# Patient Record
Sex: Male | Born: 1970 | Race: White | Hispanic: No | Smoking: Current every day smoker
Health system: Southern US, Community
[De-identification: ages and names within clinical notes are randomized; demographics above are authoritative.]

## PROBLEM LIST (undated history)

## (undated) DIAGNOSIS — I10 Essential (primary) hypertension: Secondary | ICD-10-CM

## (undated) DIAGNOSIS — E785 Hyperlipidemia, unspecified: Secondary | ICD-10-CM

## (undated) DIAGNOSIS — E669 Obesity, unspecified: Secondary | ICD-10-CM

## (undated) DIAGNOSIS — M109 Gout, unspecified: Secondary | ICD-10-CM

## (undated) DIAGNOSIS — Z720 Tobacco use: Secondary | ICD-10-CM

## (undated) HISTORY — DX: Gout, unspecified: M10.9

## (undated) HISTORY — PX: NO PAST SURGERIES: SHX2092

## (undated) HISTORY — DX: Tobacco use: Z72.0

## (undated) HISTORY — DX: Essential (primary) hypertension: I10

## (undated) HISTORY — DX: Hyperlipidemia, unspecified: E78.5

## (undated) HISTORY — DX: Obesity, unspecified: E66.9

---

## 2016-08-03 ENCOUNTER — Emergency Department (INDEPENDENT_AMBULATORY_CARE_PROVIDER_SITE_OTHER): Payer: 59

## 2016-08-03 ENCOUNTER — Emergency Department
Admission: EM | Admit: 2016-08-03 | Discharge: 2016-08-03 | Disposition: A | Discharge status: Home / Self Care | Payer: 59 | Source: Home / Self Care | Attending: Family Medicine | Admitting: Family Medicine

## 2016-08-03 ENCOUNTER — Encounter: Payer: Self-pay | Admitting: Emergency Medicine

## 2016-08-03 DIAGNOSIS — M25462 Effusion, left knee: Secondary | ICD-10-CM | POA: Diagnosis not present

## 2016-08-03 DIAGNOSIS — S8992XA Unspecified injury of left lower leg, initial encounter: Secondary | ICD-10-CM

## 2016-08-03 DIAGNOSIS — M25562 Pain in left knee: Secondary | ICD-10-CM

## 2016-08-03 MED ORDER — OXYCODONE-ACETAMINOPHEN 5-325 MG PO TABS: 1.0000 | ORAL_TABLET | Freq: Four times a day (QID) | ORAL | 0 refills | Status: DC | PRN

## 2016-08-03 NOTE — Discharge Instructions (Signed)
Percocet (oxycodone-acetaminophen) is a narcotic pain medication, do not combine these medications with others containing tylenol. While taking, do not drink alcohol, drive, or perform any other activities that requires focus while taking these medications.  ° ° ° °

## 2016-08-03 NOTE — ED Notes (Signed)
Ice pack applied, refused PRN for pain

## 2016-08-03 NOTE — ED Triage Notes (Signed)
[facility] with C/O severe pain in the left knee. Stepped off a step stool yesterday and felt a pop and had immediate pain. Rates pain 10/10. Edema noted patient refused something for pain.

## 2016-08-03 NOTE — ED Provider Notes (Signed)
CSN: [id number]     Arrival date & time 08/03/16  1208 History   First MD Initiated Contact with Patient 08/03/16 1321     Chief Complaint  Patient presents with  . Knee Pain   (Consider location/radiation/quality/duration/timing/severity/associated sxs/prior Treatment) HPI Rene Sizelove is a 46 y.o. male presenting to UC with c/o sudden onset Left knee pain that started after he stepped off a step stool and felt a pop.  Pain is 10/10 with weightbearing and movement.  He also noted swelling of his knee. Pain is aching and sore. He took Aleve PTA w/o much relief.  No prior hx of knee injury or surgery.    History reviewed. No pertinent past medical history. History reviewed. No pertinent surgical history. History reviewed. No pertinent family history. Social History  Substance Use Topics  . Smoking status: Current Every Day Smoker  . Smokeless tobacco: Current User  . Alcohol use No    Review of Systems  Musculoskeletal: Positive for arthralgias, gait problem, joint swelling and myalgias.  Skin: Negative for color change and wound.  Neurological: Negative for weakness and numbness.    Allergies  Eggs or egg-derived products  Home Medications   Prior to Admission medications   Medication Sig Start Date End Date Taking? Authorizing Provider  naproxen sodium (ANAPROX) 220 MG tablet Take 220 mg by mouth 2 (two) times daily with a meal.   Yes [provider]  oxyCODONE-acetaminophen (PERCOCET/ROXICET) 5-325 MG tablet Take 1-2 tablets by mouth every 6 (six) hours as needed for severe pain. 08/03/16   Junius Finner, PA-C   Meds Ordered and Administered this Visit  Medications - No data to display  BP (!) 132/91 (BP Location: Left Arm)   Pulse 92   Temp 97.7 F (36.5 C) (Oral)   Resp 18   Ht 6' (1.829 m)   Wt 225 lb (102.1 kg)   SpO2 97%   BMI 30.52 kg/m  No data found.   Physical Exam  Constitutional: He is oriented to person, place, and time. He appears  well-developed and well-nourished. No distress.  HENT:  Head: Normocephalic and atraumatic.  Eyes: EOM are normal.  Neck: Normal range of motion.  Cardiovascular: Normal rate.   Pulmonary/Chest: Effort normal.  Musculoskeletal: He exhibits edema and tenderness.  Left knee: moderate edema. Tenderness to superior aspect and joint space. Slight decrease extension. Limited flexion due to pain. Calf is soft, non-tender.   Neurological: He is alert and oriented to person, place, and time.  Skin: Skin is warm and dry. He is not diaphoretic.  Left knee: skin in tact. No ecchymosis or erythema.   Psychiatric: He has a normal mood and affect. His behavior is normal.  Nursing note and vitals reviewed.   Urgent Care Course     Procedures (including critical care time)  Labs Review Labs Reviewed - No data to display  Imaging Review Dg Knee Complete 4 Views Left  Result Date: 08/03/2016 CLINICAL DATA:  Pain, swelling of left knee.  Felt a pop yesterday. EXAM: LEFT KNEE - COMPLETE 4+ VIEW COMPARISON:  None. FINDINGS: Moderate joint effusion. No acute bony abnormality. Specifically, no fracture, subluxation, or dislocation. Soft tissues are intact. IMPRESSION: Moderate joint effusion.  No acute bony abnormality. Electronically Signed   By: Charlett Nose M.D.   On: 08/03/2016 13:41    MDM   1. Left knee injury, initial encounter   2. Pain and swelling of left knee   3. Effusion of left knee joint  Left knee: moderate edema with pain after feeling a Pop yesterday coming off a step stool  Plain films: moderate joint effusion Knee brace applied, crutches provided for comfort.  Pt states Vicodin does not help with pain. Checked [place] Controlled Substance Reporting System. No prescriptions listed for the last 3 years.  Rx: Percocet  Encouraged to call Dr. Benjamin Stainhekkekandam, Sports Medicine for f/u tomorrow.     Junius FinnerO'Malley, Millard Bautch, PA-C 08/03/16 [phone]

## 2016-08-03 NOTE — ED Notes (Signed)
Applied a knee brace and fitted for crutches.

## 2016-08-04 ENCOUNTER — Encounter: Payer: Self-pay | Admitting: Sports Medicine

## 2016-08-04 ENCOUNTER — Ambulatory Visit (INDEPENDENT_AMBULATORY_CARE_PROVIDER_SITE_OTHER): Payer: 59 | Admitting: Sports Medicine

## 2016-08-04 DIAGNOSIS — M119 Crystal arthropathy, unspecified: Secondary | ICD-10-CM | POA: Diagnosis not present

## 2016-08-04 DIAGNOSIS — M6580 Other synovitis and tenosynovitis, unspecified site: Secondary | ICD-10-CM | POA: Diagnosis not present

## 2016-08-04 DIAGNOSIS — M109 Gout, unspecified: Secondary | ICD-10-CM | POA: Insufficient documentation

## 2016-08-04 NOTE — Assessment & Plan Note (Signed)
Suspect gout. Crystal analysis. In a week we are going to check his uric acid levels, he tells me they've been as high as 9 before. I do suspect we will need to add allopurinol. He's also going to establish with my partners for primary care. I don't think he needs an MRI.

## 2016-08-04 NOTE — Patient Instructions (Signed)
Low-Purine Diet Purines are compounds that affect the level of uric acid in your body. A low-purine diet is a diet that is low in purines. Eating a low-purine diet can prevent the level of uric acid in your body from getting too high and causing gout or kidney stones or both. What do I need to know about this diet?  Choose low-purine foods. Examples of low-purine foods are listed in the next section.  Drink plenty of fluids, especially water. Fluids can help remove uric acid from your body. Try to drink 8-16 cups (1.9-3.8 L) a day.  Limit foods high in fat, especially saturated fat, as fat makes it harder for the body to get rid of uric acid. Foods high in saturated fat include pizza, cheese, ice cream, whole milk, fried foods, and gravies. Choose foods that are lower in fat and lean sources of protein. Use olive oil when cooking as it contains healthy fats that are not high in saturated fat.  Limit alcohol. Alcohol interferes with the elimination of uric acid from your body. If you are having a gout attack, avoid all alcohol.  Keep in mind that different people's bodies react differently to different foods. You will probably learn over time which foods do or do not affect you. If you discover that a food tends to cause your gout to flare up, avoid eating that food. You can more freely enjoy foods that do not cause problems. If you have any questions about a food item, talk to your dietitian or health care provider. Which foods are low, moderate, and high in purines? The following is a list of foods that are low, moderate, and high in purines. You can eat any amount of the foods that are low in purines. You may be able to have small amounts of foods that are moderate in purines. Ask your health care provider how much of a food moderate in purines you can have. Avoid foods high in purines. Grains  Foods low in purines: Enriched white bread, pasta, rice, cake, cornbread, popcorn.  Foods moderate in  purines: Whole-grain breads and cereals, wheat germ, bran, oatmeal. Uncooked oatmeal. Dry wheat bran or wheat germ.  Foods high in purines: Pancakes, French toast, biscuits, muffins. Vegetables  Foods low in purines: All vegetables, except those that are moderate in purines.  Foods moderate in purines: Asparagus, cauliflower, spinach, mushrooms, green peas. Fruits  All fruits are low in purines. Meats and other Protein Foods  Foods low in purines: Eggs, nuts, peanut butter.  Foods moderate in purines: 80-90% lean beef, lamb, veal, pork, poultry, fish, eggs, peanut butter, nuts. Crab, lobster, oysters, and shrimp. Cooked dried beans, peas, and lentils.  Foods high in purines: Anchovies, sardines, herring, mussels, tuna, codfish, scallops, trout, and haddock. Bacon. Organ meats (such as liver or kidney). Tripe. Game meat. Goose. Sweetbreads. Dairy  All dairy foods are low in purines. Low-fat and fat-free dairy products are best because they are low in saturated fat. Beverages  Drinks low in purines: Water, carbonated beverages, tea, coffee, cocoa.  Drinks moderate in purines: Soft drinks and other drinks sweetened with high-fructose corn syrup. Juices. To find whether a food or drink is sweetened with high-fructose corn syrup, look at the ingredients list.  Drinks high in purines: Alcoholic beverages (such as beer). Condiments  Foods low in purines: Salt, herbs, olives, pickles, relishes, vinegar.  Foods moderate in purines: Butter, margarine, oils, mayonnaise. Fats and Oils  Foods low in purines: All types, except gravies   and sauces made with meat.  Foods high in purines: Gravies and sauces made with meat. Other Foods  Foods low in purines: Sugars, sweets, gelatin. Cake. Soups made without meat.  Foods moderate in purines: Meat-based or fish-based soups, broths, or bouillons. Foods and drinks sweetened with high-fructose corn syrup.  Foods high in purines: High-fat desserts  (such as ice cream, cookies, cakes, pies, doughnuts, and chocolate). Contact your dietitian for more information on foods that are not listed here. This information is not intended to replace advice given to you by your health care provider. Make sure you discuss any questions you have with your health care provider. Document Released: 06/14/2010 Document Revised: 07/26/2015 Document Reviewed: 01/24/2013 Elsevier Interactive Patient Education  2017 Else[employer] Inc[employer]  Gout Gout is painful swelling that can occur in some of your joints. Gout is a type of arthritis. This condition is caused by having too much uric acid in your body. Uric acid is a chemical that forms when your body breaks down substances called purines. Purines are important for building body proteins. When your body has too much uric acid, sharp crystals can form and build up inside your joints. This causes pain and swelling. Gout attacks can happen quickly and be very painful (acute gout). Over time, the attacks can affect more joints and become more frequent (chronic gout). Gout can also cause uric acid to build up under your skin and inside your kidneys. What are the causes? This condition is caused by too much uric acid in your blood. This can occur because:  Your kidneys do not remove enough uric acid from your blood. This is the most common cause.  Your body makes too much uric acid. This can occur with some cancers and cancer treatments. It can also occur if your body is breaking down too many red blood cells (hemolytic anemia).  You eat too many foods that are high in purines. These foods include organ meats and some seafood. Alcohol, especially beer, is also high in purines.  A gout attack may be triggered by trauma or stress. What increases the risk? This condition is more likely to develop in people who:  Have a family history of gout.  Are male and middle-aged.  Are male and have gone through menopause.  Are  obese.  Frequently drink alcohol, especially beer.  Are dehydrated.  Lose weight too quickly.  Have an organ transplant.  Have lead poisoning.  Take certain medicines, including aspirin, cyclosporine, diuretics, levodopa, and niacin.  Have kidney disease or psoriasis.  What are the signs or symptoms? An attack of acute gout happens quickly. It usually occurs in just one joint. The most common place is the big toe. Attacks often start at night. Other joints that may be affected include joints of the feet, ankle, knee, fingers, wrist, or elbow. Symptoms may include:  Severe pain.  Warmth.  Swelling.  Stiffness.  Tenderness. The affected joint may be very painful to touch.  Shiny, red, or purple skin.  Chills and fever.  Chronic gout may cause symptoms more frequently. More joints may be involved. You may also have white or yellow lumps (tophi) on your hands or feet or in other areas near your joints. How is this diagnosed? This condition is diagnosed based on your symptoms, medical history, and physical exam. You may have tests, such as:  Blood tests to measure uric acid levels.  Removal of joint fluid with a needle (aspiration) to look for uric acid crystals.    X-rays to look for joint damage.  How is this treated? Treatment for this condition has two phases: treating an acute attack and preventing future attacks. Acute gout treatment may include medicines to reduce pain and swelling, including:  NSAIDs.  Steroids. These are strong anti-inflammatory medicines that can be taken by mouth (orally) or injected into a joint.  Colchicine. This medicine relieves pain and swelling when it is taken soon after an attack. It can be given orally or through an IV tube.  Preventive treatment may include:  Daily use of smaller doses of NSAIDs or colchicine.  Use of a medicine that reduces uric acid levels in your blood.  Changes to your diet. You may need to see a specialist  about healthy eating (dietitian).  Follow these instructions at home: During a Gout Attack  If directed, apply ice to the affected area: ? Put ice in a plastic bag. ? Place a towel between your skin and the bag. ? Leave the ice on for 20 minutes, 2-3 times a day.  Rest the joint as much as possible. If the affected joint is in your leg, you may be given crutches to use.  Raise (elevate) the affected joint above the level of your heart as often as possible.  Drink enough fluids to keep your urine clear or pale yellow.  Take over-the-counter and prescription medicines only as told by your health care provider.  Do not drive or operate heavy machinery while taking prescription pain medicine.  Follow instructions from your health care provider about eating or drinking restrictions.  Return to your normal activities as told by your health care provider. Ask your health care provider what activities are safe for you. Avoiding Future Gout Attacks  Follow a low-purine diet as told by your dietitian or health care provider. Avoid foods and drinks that are high in purines, including liver, kidney, anchovies, asparagus, herring, mushrooms, mussels, and beer.  Limit alcohol intake to no more than 1 drink a day for nonpregnant women and 2 drinks a day for men. One drink equals 12 oz of beer, 5 oz of wine, or 1 oz of hard liquor.  Maintain a healthy weight or lose weight if you are overweight. If you want to lose weight, talk with your health care provider. It is important that you do not lose weight too quickly.  Start or maintain an exercise program as told by your health care provider.  Drink enough fluids to keep your urine clear or pale yellow.  Take over-the-counter and prescription medicines only as told by your health care provider.  Keep all follow-up visits as told by your health care provider. This is important. Contact a health care provider if:  You have another gout  attack.  You continue to have symptoms of a gout attack after10 days of treatment.  You have side effects from your medicines.  You have chills or a fever.  You have burning pain when you urinate.  You have pain in your lower back or belly. Get help right away if:  You have severe or uncontrolled pain.  You cannot urinate. This information is not intended to replace advice given to you by your health care provider. Make sure you discuss any questions you have with your health care provider. Document Released: 02/15/2000 Document Revised: 07/26/2015 Document Reviewed: 11/30/2014 Elsevier Interactive Patient Education  2017 Elsevier Inc.  

## 2016-08-04 NOTE — Progress Notes (Signed)
   Subjective:    I'm seeing this patient as a consultation for:  Junius FinnerErin O'Malley PA-C  CC: left knee pain  HPI: Darren Gray is a 46 y.o. Male with a history of gout who presents with left knee pain and swelling. He stepped off of a stool on Saturday and heard a pop, and felt a slight pain, but he was able to put his weight on it and go about his day as normal. Sunday morning at around 6 am he awoke with a 10/10 knee pain and swelling, and he presented to urgent care where they prescribed Percocet, but he was unable to fill the prescription because of the pain. 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   Past medical history:  Negative.  See flowsheet/record as well for more information.  Surgical history: Negative.  See flowsheet/record as well for more information.  Family history: Negative.  See flowsheet/record as well for more information.  Social history: Negative.  See flowsheet/record as well for more information.  Allergies, and medications have been entered into the medical record, reviewed, and no changes needed.   Review of Systems: No headache, visual changes, nausea, vomiting, diarrhea, constipation, dizziness, abdominal pain, skin rash, fevers, chills, night sweats, weight loss, swollen lymph nodes, body aches, joint swelling, muscle aches, chest pain, shortness of breath, mood changes, visual or auditory hallucinations.   Objective:   General: Well Developed, well nourished, and in no acute distress.  Neuro/Psych: Alert and oriented x3, extra-ocular muscles intact, able to move all 4 extremities, sensation grossly intact. Skin: Warm and dry, no rashes noted.  Respiratory: Not using accessory muscles, speaking in full sentences, trachea midline.  Cardiovascular: Pulses palpable, no extremity edema. Abdomen: Does not appear distended. Left Knee: Knee is significantly swollen, but is not noticeably warm  or erythematous. TTP diffusely in the anterior joint lines both medially and laterally. No bony tenderness. ROM severely reduced due to swelling. 30 degrees of extension lag, can flex to 90 degrees. Unable to get adequate ligament exam due to patient guarding.    Impression and Recommendations:   This case required medical decision making of moderate complexity.  Darren Gray is a 46 y.o. 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  Joint aspiration yielded cloudy yellow fluid that was sent for crystal analysis. We will check uric acid levels in one week. He has had levels as high as 9 before. We may need to start allopurinol.   Because clinical presentation is more consistent with gouty arthritis than ligament derangement, an MRI is not indicated at this junction.  We also discussed establishing primary care with one of the partners here.

## 2016-08-05 LAB — SYNOVIAL CELL COUNT + DIFF, W/ CRYSTALS
Basophils, %: 0 %
Eosinophils-Synovial: 0 % (ref 0–2)
Lymphocytes-Synovial Fld: 2 % (ref 0–74)
Monocyte/Macrophage: 3 % (ref 0–69)
Neutrophil, Synovial: 95 % — ABNORMAL HIGH (ref 0–24)
Synoviocytes, %: 0 % (ref 0–15)
WBC, Synovial: 43485 {cells}/uL — ABNORMAL HIGH (ref <150)

## 2016-08-09 LAB — BODY FLUID CULTURE
Gram Stain: NONE SEEN
Organism ID, Bacteria: NO GROWTH

## 2016-08-15 ENCOUNTER — Encounter: Payer: Self-pay | Admitting: Physician Assistant

## 2016-08-15 ENCOUNTER — Ambulatory Visit (INDEPENDENT_AMBULATORY_CARE_PROVIDER_SITE_OTHER): Payer: 59 | Admitting: Physician Assistant

## 2016-08-15 ENCOUNTER — Ambulatory Visit (INDEPENDENT_AMBULATORY_CARE_PROVIDER_SITE_OTHER): Payer: 59 | Admitting: Sports Medicine

## 2016-08-15 ENCOUNTER — Encounter: Payer: Self-pay | Admitting: Sports Medicine

## 2016-08-15 VITALS — BP 135/90 | HR 98 | Ht 72.0 in | Wt 240.0 lb

## 2016-08-15 DIAGNOSIS — Z6832 Body mass index (BMI) 32.0-32.9, adult: Secondary | ICD-10-CM

## 2016-08-15 DIAGNOSIS — M1A0690 Idiopathic chronic gout, unspecified knee, without tophus (tophi): Secondary | ICD-10-CM

## 2016-08-15 DIAGNOSIS — Z13220 Encounter for screening for lipoid disorders: Secondary | ICD-10-CM | POA: Diagnosis not present

## 2016-08-15 DIAGNOSIS — Z720 Tobacco use: Secondary | ICD-10-CM | POA: Insufficient documentation

## 2016-08-15 DIAGNOSIS — Z8249 Family history of ischemic heart disease and other diseases of the circulatory system: Secondary | ICD-10-CM

## 2016-08-15 DIAGNOSIS — Z7689 Persons encountering health services in other specified circumstances: Secondary | ICD-10-CM | POA: Diagnosis not present

## 2016-08-15 DIAGNOSIS — E6609 Other obesity due to excess calories: Secondary | ICD-10-CM | POA: Diagnosis not present

## 2016-08-15 DIAGNOSIS — I10 Essential (primary) hypertension: Secondary | ICD-10-CM | POA: Diagnosis not present

## 2016-08-15 DIAGNOSIS — Z131 Encounter for screening for diabetes mellitus: Secondary | ICD-10-CM

## 2016-08-15 DIAGNOSIS — E66811 Obesity, class 1: Secondary | ICD-10-CM | POA: Insufficient documentation

## 2016-08-15 DIAGNOSIS — F17200 Nicotine dependence, unspecified, uncomplicated: Secondary | ICD-10-CM

## 2016-08-15 MED ORDER — LISINOPRIL 10 MG PO TABS
10.0000 mg | ORAL_TABLET | Freq: Every day | ORAL | 3 refills | Status: AC
Start: 2016-08-15 — End: ?

## 2016-08-15 NOTE — Assessment & Plan Note (Addendum)
Did well after knee aspiration and injection.  Checking uric acid levels with his other blood work. I'll add allopurinol with a goal of less than 5 if elevated. After starting allopurinol we will recheck uric acid level one month afterwards.  Uric acid levels are extremely high, adding allopurinol, recheck in one month.

## 2016-08-15 NOTE — Progress Notes (Signed)
  Subjective:    CC: Follow-up  HPI: This is a pleasant 46 year old male, we aspirated and injected his knee at the last visit, he did have uric acid crystals in the aspirate. He had a rapid improvement and is pain-free now.  Past medical history:  Negative.  See flowsheet/record as well for more information.  Surgical history: Negative.  See flowsheet/record as well for more information.  Family history: Negative.  See flowsheet/record as well for more information.  Social history: Negative.  See flowsheet/record as well for more information.  Allergies, and medications have been entered into the medical record, reviewed, and no changes needed.   Review of Systems: No fevers, chills, night sweats, weight loss, chest pain, or shortness of breath.   Objective:    General: Well Developed, well nourished, and in no acute distress.  Neuro: Alert and oriented x3, extra-ocular muscles intact, sensation grossly intact.  HEENT: Normocephalic, atraumatic, pupils equal round reactive to light, neck supple, no masses, no lymphadenopathy, thyroid nonpalpable.  Skin: Warm and dry, no rashes. Cardiac: Regular rate and rhythm, no murmurs rubs or gallops, no lower extremity edema.  Respiratory: Clear to auscultation bilaterally. Not using accessory muscles, speaking in full sentences.  Impression and Recommendations:    Gout Did well after knee aspiration and injection.  Checking uric acid levels with his other blood work. I'll add allopurinol with a goal of less than 5 if elevated. After starting allopurinol we will recheck uric acid level one month afterwards.  I spent 25 minutes with this patient, greater than 50% was face-to-face time counseling regarding the above diagnoses

## 2016-08-15 NOTE — Progress Notes (Signed)
HPI:                                                                Darren Gray is a 46 y.o. male who presents to [facility]: Primary Care Sports Medicine today to establish care   Current Concerns include none  Tobacco use: 2 ppd cigarette smoker, down from 3 ppd x 29 years. Patient reports he has tried patches in the past and was successful for about 2 months. He has also tried Wellbutrin, but states he had mood changes. He is not open to smoking cessation at this time and does not believe his smoking poses any significant health risk because his relatives were smokers and lived well into old age.  Gout: left knee. Followed by Sports Medicine.  HTN: patient reports that "my lower number has always run high." He has never been prescribed antihypertensive medication. Denies vision change, headache, chest pain with exertion, orthopnea, lightheadedness, syncope and edema. Risk factors include: tobacco use  Health Maintenance Health Maintenance  Topic Date Due  . HIV Screening  04/19/1986  . TETANUS/TDAP  04/19/1990  . INFLUENZA VACCINE  10/01/2016    Past Medical History:  Diagnosis Date  . Gout   . Hyperlipidemia   . Hypertension   . Obesity   . Tobacco abuse    Past Surgical History:  Procedure Laterality Date  . NO PAST SURGERIES     Social History  Substance Use Topics  . Smoking status: Current Every Day Smoker    Packs/day: 2.00    Years: 29.00    Types: Cigarettes  . Smokeless tobacco: Never Used  . Alcohol use No   family history includes Arthritis in his mother; Emphysema in his maternal grandmother; Heart attack in his father; Hypertension in his father; Skin cancer in his father; Stroke in his father.  ROS: negative except as noted in the HPI  Medications: Current Outpatient Prescriptions  Medication Sig Dispense Refill  . naproxen sodium (ANAPROX) 220 MG tablet Take 220 mg by mouth 2 (two) times daily with a meal.    . lisinopril  (PRINIVIL,ZESTRIL) 10 MG tablet Take 1 tablet (10 mg total) by mouth daily. 30 tablet 3   No current facility-administered medications for this visit.    Allergies  Allergen Reactions  . Eggs Or Egg-Derived Products     Objective:  BP 135/90   Pulse 98   Ht 6' (1.829 m)   Wt 240 lb (108.9 kg)   BMI 32.55 kg/m  Gen: well-groomed, cooperative, not ill-appearing, no distress HEENT: normal conjunctiva, wearing glasses, EOM's intact Pulm: Normal work of breathing, normal phonation, clear to auscultation bilaterally, breath sounds diminished on expiration CV: Normal rate, regular rhythm, s1 and s2 distinct, no murmurs, clicks or rubs, no carotid bruit Neuro: alert and oriented x 3, normal tone, no tremor MSK: moving all extremities, normal gait and station, no peripheral edema Skin: warm and dry, no rashes on exposed skin Psych: normal affect, euthymic mood, normal speech and thought content   No results found for this or any previous visit (from the past 72 hour(s)). No results found.    Assessment and Plan: 46 y.o. male with   1. Encounter to establish care - reviewed PMH/PSH  2. Family history  of heart attack - patient reports father had first heart attack in his 2560's, followed by 3-4 more heart attacks  3. Heavy tobacco smoker - patient is not open to quitting - patient declines pneumovax  4. Hypertension goal BP (blood pressure) < 130/80 - starting Lisinopril 10mg . Plan to recheck BP in 2 weeks - therapeutic lifestyle changes - lisinopril (PRINIVIL,ZESTRIL) 10 MG tablet; Take 1 tablet (10 mg total) by mouth daily.  Dispense: 30 tablet; Refill: 3 - CBC - Comprehensive metabolic panel  5. Class 1 obesity due to excess calories with serious comorbidity and body mass index (BMI) of 32.0 to 32.9 in adult - therapeutic lifestyle changes - HgbA1c  6. Encounter for screening for lipid disorder - Lipid Panel w/reflex Direct LDL  7. Screening for diabetes mellitus -  Hemoglobin A1c  Patient education and anticipatory guidance given Patient agrees with treatment plan Follow-up in 3 months annual physical or sooner as needed  Levonne Hubertharley E. Cummings PA-C

## 2016-08-15 NOTE — Patient Instructions (Signed)
I have also ordered fasting labs. The lab is a walk-in open M-F 7:30a-4:30p (closed 12:30-1:30p). Nothing to eat or drink after midnight or at least 8 hours before your blood draw. You can have water and your medications.   For your blood pressure: - Start Lisinopril 10mg  every morning - Check blood pressure at home for the next 2 weeks - Check around the same time each day in a relaxed setting - Limit salt. Follow DASH eating plan - Follow-up in 2 weeks for a nurse visit. Bring your blood pressure log   Managing Your Hypertension Hypertension is commonly called high blood pressure. This is when the force of your blood pressing against the walls of your arteries is too strong. Arteries are blood vessels that carry blood from your heart throughout your body. Hypertension forces the heart to work harder to pump blood, and may cause the arteries to become narrow or stiff. Having untreated or uncontrolled hypertension can cause heart attack, stroke, kidney disease, and other problems. What are blood pressure readings? A blood pressure reading consists of a higher number over a lower number. Ideally, your blood pressure should be below 120/80. The first ("top") number is called the systolic pressure. It is a measure of the pressure in your arteries as your heart beats. The second ("bottom") number is called the diastolic pressure. It is a measure of the pressure in your arteries as the heart relaxes. What does my blood pressure reading mean? Blood pressure is classified into four stages. Based on your blood pressure reading, your health care provider may use the following stages to determine what type of treatment you need, if any. Systolic pressure and diastolic pressure are measured in a unit called mm Hg. Normal  Systolic pressure: below 120.  Diastolic pressure: below 80. Elevated  Systolic pressure: 120-129.  Diastolic pressure: below 80. Hypertension stage 1  Systolic pressure:  130-139.  Diastolic pressure: 80-89. Hypertension stage 2  Systolic pressure: 140 or above.  Diastolic pressure: 90 or above. What health risks are associated with hypertension? Managing your hypertension is an important responsibility. Uncontrolled hypertension can lead to:  A heart attack.  A stroke.  A weakened blood vessel (aneurysm).  Heart failure.  Kidney damage.  Eye damage.  Metabolic syndrome.  Memory and concentration problems.  What changes can I make to manage my hypertension? Hypertension can be managed by making lifestyle changes and possibly by taking medicines. Your health care provider will help you make a plan to bring your blood pressure within a normal range. Eating and drinking  Eat a diet that is high in fiber and potassium, and low in salt (sodium), added sugar, and fat. An example eating plan is called the DASH (Dietary Approaches to Stop Hypertension) diet. To eat this way: ? Eat plenty of fresh fruits and vegetables. Try to fill half of your plate at each meal with fruits and vegetables. ? Eat whole grains, such as whole wheat pasta, brown rice, or whole grain bread. Fill about one quarter of your plate with whole grains. ? Eat low-fat diary products. ? Avoid fatty cuts of meat, processed or cured meats, and poultry with skin. Fill about one quarter of your plate with lean proteins such as fish, chicken without skin, beans, eggs, and tofu. ? Avoid premade and processed foods. These tend to be higher in sodium, added sugar, and fat.  Reduce your daily sodium intake. Most people with hypertension should eat less than 1,500 mg of sodium a day.  Limit alcohol intake to no more than 1 drink a day for nonpregnant women and 2 drinks a day for men. One drink equals 12 oz of beer, 5 oz of wine, or 1 oz of hard liquor. Lifestyle  Work with your health care provider to maintain a healthy body weight, or to lose weight. Ask what an ideal weight is for  you.  Get at least 30 minutes of exercise that causes your heart to beat faster (aerobic exercise) most days of the week. Activities may include walking, swimming, or biking.  Include exercise to strengthen your muscles (resistance exercise), such as weight lifting, as part of your weekly exercise routine. Try to do these types of exercises for 30 minutes at least 3 days a week.  Do not use any products that contain nicotine or tobacco, such as cigarettes and e-cigarettes. If you need help quitting, ask your health care provider.  Control any long-term (chronic) conditions you have, such as high cholesterol or diabetes. Monitoring  Monitor your blood pressure at home as told by your health care provider. Your personal target blood pressure may vary depending on your medical conditions, your age, and other factors.  Have your blood pressure checked regularly, as often as told by your health care provider. Working with your health care provider  Review all the medicines you take with your health care provider because there may be side effects or interactions.  Talk with your health care provider about your diet, exercise habits, and other lifestyle factors that may be contributing to hypertension.  Visit your health care provider regularly. Your health care provider can help you create and adjust your plan for managing hypertension. Will I need medicine to control my blood pressure? Your health care provider may prescribe medicine if lifestyle changes are not enough to get your blood pressure under control, and if:  Your systolic blood pressure is 130 or higher.  Your diastolic blood pressure is 80 or higher.  Take medicines only as told by your health care provider. Follow the directions carefully. Blood pressure medicines must be taken as prescribed. The medicine does not work as well when you skip doses. Skipping doses also puts you at risk for problems. Contact a health care provider  if:  You think you are having a reaction to medicines you have taken.  You have repeated (recurrent) headaches.  You feel dizzy.  You have swelling in your ankles.  You have trouble with your vision. Get help right away if:  You develop a severe headache or confusion.  You have unusual weakness or numbness, or you feel faint.  You have severe pain in your chest or abdomen.  You vomit repeatedly.  You have trouble breathing. Summary  Hypertension is when the force of blood pumping through your arteries is too strong. If this condition is not controlled, it may put you at risk for serious complications.  Your personal target blood pressure may vary depending on your medical conditions, your age, and other factors. For most people, a normal blood pressure is less than 120/80.  Hypertension is managed by lifestyle changes, medicines, or both. Lifestyle changes include weight loss, eating a healthy, low-sodium diet, exercising more, and limiting alcohol. This information is not intended to replace advice given to you by your health care provider. Make sure you discuss any questions you have with your health care provider. Document Released: 11/12/2011 Document Revised: 01/16/2016 Document Reviewed: 01/16/2016 Elsevier Interactive Patient Education  [employer]2018 [employer]

## 2016-08-20 LAB — CBC
HCT: 44 % (ref 38.5–50.0)
Hemoglobin: 15.5 g/dL (ref 13.2–17.1)
MCH: 30.9 pg (ref 27.0–33.0)
MCHC: 35.2 g/dL (ref 32.0–36.0)
MCV: 87.8 fL (ref 80.0–100.0)
MPV: 9.9 fL (ref 7.5–12.5)
Platelets: 157 10*3/uL (ref 140–400)
RBC: 5.01 MIL/uL (ref 4.20–5.80)
RDW: 13.4 % (ref 11.0–15.0)
WBC: 7.9 10*3/uL (ref 3.8–10.8)

## 2016-08-20 LAB — COMPREHENSIVE METABOLIC PANEL
ALT: 33 U/L (ref 9–46)
AST: 25 U/L (ref 10–40)
Albumin: 4.2 g/dL (ref 3.6–5.1)
Alkaline Phosphatase: 44 U/L (ref 40–115)
BUN: 18 mg/dL (ref 7–25)
CHLORIDE: 106 mmol/L (ref 98–110)
CO2: 23 mmol/L (ref 20–31)
CREATININE: 1.15 mg/dL (ref 0.60–1.35)
Calcium: 9 mg/dL (ref 8.6–10.3)
Glucose, Bld: 96 mg/dL (ref 65–99)
Potassium: 4.3 mmol/L (ref 3.5–5.3)
SODIUM: 139 mmol/L (ref 135–146)
Total Bilirubin: 0.6 mg/dL (ref 0.2–1.2)
Total Protein: 6.6 g/dL (ref 6.1–8.1)

## 2016-08-20 LAB — LIPID PANEL W/REFLEX DIRECT LDL
Cholesterol: 172 mg/dL (ref <200)
HDL: 29 mg/dL — ABNORMAL LOW (ref >40)
LDL-Cholesterol: 120 mg/dL — ABNORMAL HIGH
NON-HDL CHOLESTEROL (CALC): 143 mg/dL — AB (ref <130)
TRIGLYCERIDES: 121 mg/dL (ref <150)
Total CHOL/HDL Ratio: 5.9 Ratio — ABNORMAL HIGH (ref <5.0)

## 2016-08-21 LAB — HEMOGLOBIN A1C
Hgb A1c MFr Bld: 5.5 % (ref <5.7)
MEAN PLASMA GLUCOSE: 111 mg/dL

## 2016-08-21 LAB — URIC ACID: Uric Acid, Serum: 8.3 mg/dL — ABNORMAL HIGH (ref 4.0–8.0)

## 2016-08-21 MED ORDER — ALLOPURINOL 300 MG PO TABS: 300.0000 mg | ORAL_TABLET | Freq: Every day | ORAL | 3 refills | Status: AC

## 2016-08-21 NOTE — Addendum Note (Signed)
Addended by: Monica BectonHEKKEKANDAM, Quiana Cobaugh J on: 08/21/2016 08:24 AM   Modules accepted: Orders

## 2016-08-22 NOTE — Progress Notes (Signed)
Your labs look good - normal kidney function - cholesterol in a healthy range - normal blood counts - no evidence of diabetes  

## 2016-08-24 ENCOUNTER — Emergency Department
Admission: EM | Admit: 2016-08-24 | Discharge: 2016-08-24 | Disposition: A | Discharge status: Home / Self Care | Payer: 59 | Source: Home / Self Care | Attending: Family Medicine | Admitting: Family Medicine

## 2016-08-24 ENCOUNTER — Encounter: Payer: Self-pay | Admitting: Emergency Medicine

## 2016-08-24 DIAGNOSIS — L03213 Periorbital cellulitis: Secondary | ICD-10-CM

## 2016-08-24 DIAGNOSIS — J0100 Acute maxillary sinusitis, unspecified: Secondary | ICD-10-CM

## 2016-08-24 MED ORDER — CLINDAMYCIN HCL 300 MG PO CAPS: ORAL_CAPSULE | ORAL | 0 refills | Status: DC

## 2016-08-24 MED ORDER — SULFACETAMIDE SODIUM 10 % OP SOLN: 1.0000 [drp] | OPHTHALMIC | 0 refills | Status: DC

## 2016-08-24 NOTE — ED Provider Notes (Signed)
[facility]    CSN: [id number] Arrival date & time: 08/24/16  1108     History   Chief Complaint Chief Complaint  Patient presents with  . Eye Pain    HPI Darren Gray is a 46 y.o. male.   Patient complains of two day history of increased sinus congestion and right eye irritation, with soreness of his right upper and lower lids.  He is beginning to get some slight irritation in his left eye.  He states that he always gets eye redness when he is developing a sinus infection.  He takes Claritin year round, which has not been helpful lately.  No sore throat or URI symptoms.   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   The history is provided by the patient.  Eye Pain  This is a recurrent problem. The current episode started 2 days ago. The problem occurs constantly. The problem has been gradually worsening. Associated symptoms comments: Sinus congestion. Exacerbated by: bright light. Relieved by: sunglasses helpfu. Treatments tried: Claritin. The treatment provided no relief.    Past Medical History:  Diagnosis Date  . Gout   . Hyperlipidemia   . Hypertension   . Obesity   . Tobacco abuse     Patient Active Problem List   Diagnosis Date Noted  . Family history of heart attack 08/15/2016  . Hypertension goal BP (blood pressure) < 130/80 08/15/2016  . Class 1 obesity due to excess calories with serious comorbidity and body mass index (BMI) of 32.0 to 32.9 in adult 08/15/2016  . Tobacco use 08/15/2016  . Gout 08/04/2016    Past Surgical History:  Procedure Laterality Date  . NO PAST SURGERIES         Home Medications    Prior to Admission medications   Medication Sig Start Date End Date Taking? Authorizing Provider  allopurinol (ZYLOPRIM) 300 MG tablet Take 1 tablet (300 mg total) by mouth daily. 08/21/16   Monica Bectonhekkekandam, Thomas J, MD  clindamycin (CLEOCIN) 300 MG capsule Take one cap by mouth every 8  hours. 08/24/16   Lattie HawBeese, Jillianna Stanek A, MD  lisinopril (PRINIVIL,ZESTRIL) 10 MG tablet Take 1 tablet (10 mg total) by mouth daily. 08/15/16   Carlis Stableummings, Charley Elizabeth, PA-C  naproxen sodium (ANAPROX) 220 MG tablet Take 220 mg by mouth 2 (two) times daily with a meal.    [provider]  sulfacetamide (BLEPH-10) 10 % ophthalmic solution Place 1-2 drops into the right eye every 3 (three) hours. (Use while awake) 08/24/16   Lattie HawBeese, Taccara Bushnell A, MD    Family History Family History  Problem Relation Age of Onset  . Skin cancer Father   . Heart attack Father   . Stroke Father   . Hypertension Father   . Arthritis Mother   . Emphysema Maternal Grandmother     Social History Social History  Substance Use Topics  . Smoking status: Current Every Day Smoker    Packs/day: 2.00    Years: 29.00    Types: Cigarettes  . Smokeless tobacco: Never Used  . Alcohol use No     Allergies   Eggs or egg-derived products   Review of Systems Review of Systems  HENT: Positive for congestion and sinus pressure. Negative for sore throat.   Eyes: Positive for photophobia, pain, discharge and redness. Negative for itching and visual disturbance.  All other systems reviewed and are negative.    Physical Exam  Triage Vital Signs ED Triage Vitals [08/24/16 1131]  Enc Vitals Group     BP (!) 142/85     Pulse Rate 92     Resp      Temp 98.8 F (37.1 C)     Temp Source Oral     SpO2 99 %     Weight 238 lb (108 kg)     Height 5\' 11"  (1.803 m)     Head Circumference      Peak Flow      Pain Score 10     Pain Loc      Pain Edu?      Excl. in GC?    No data found.   Updated Vital Signs BP (!) 142/85 (BP Location: Left Arm)   Pulse 92   Temp 98.8 F (37.1 C) (Oral)   Ht 5\' 11"  (1.803 m)   Wt 238 lb (108 kg)   SpO2 99%   BMI 33.19 kg/m   Visual Acuity Right Eye Distance: 20/25 w/glasses Left Eye Distance: 20/20 w/glasses Bilateral Distance: 20/25 w/glasses  Right Eye Near:     Left Eye Near:    Bilateral Near:     Physical Exam  Constitutional: He appears well-developed and well-nourished. No distress.  HENT:  Head: Atraumatic.  Right Ear: Tympanic membrane, external ear and ear canal normal.  Left Ear: Tympanic membrane, external ear and ear canal normal.  Nose: Mucosal edema present.  Mouth/Throat: Oropharynx is clear and moist.  Eyes: EOM are normal. Pupils are equal, round, and reactive to light. Right eye exhibits no chemosis and no hordeolum. Right conjunctiva is injected. Left conjunctiva is not injected.    Right upper and lower lids are slightly swollen and tender to palpation.   There is mild tenderness to palpation over the right maxillary sinus.  Note a tiny embedded corneal foreign body as shown on diagram.  Patient reports that this has been present for years.  Neck: Neck supple.  Cardiovascular: Normal rate.   Pulmonary/Chest: Effort normal.  Lymphadenopathy:    He has no cervical adenopathy.  Neurological: He is alert.  Skin: Skin is warm and dry.  Nursing note and vitals reviewed.    UC Treatments / Results  Labs (all labs ordered are listed, but only abnormal results are displayed) Labs Reviewed - No data to display  EKG  EKG Interpretation None       Radiology No results found.  Procedures Procedures (including critical care time)  Medications Ordered in UC Medications - No data to display   Initial Impression / Assessment and Plan / UC Course  I have reviewed the triage vital signs and the nursing notes.  Pertinent labs & imaging results that were available during my care of the patient were reviewed by me and considered in my medical decision making (see chart for details).    Begin Clindamycin 300mg  Q8hr, and sulfacetamide ophthalmic suspension. For sinus congestion, may use Afrin nasal spray (or generic oxymetazoline) each morning for about 5 days and then discontinue.  Also recommend using saline nasal  spray several times daily and saline nasal irrigation (AYR is a common brand).  Use Flonase nasal spray each morning after using Afrin nasal spray and saline nasal irrigation. Followup with Family Doctor if not improved in 4 to 5 days.  Followup with ophthalmologist if increasing eye pain develops.    Final Clinical Impressions(s) / UC Diagnoses   Final diagnoses:  Preseptal cellulitis of right eye  Acute  maxillary sinusitis, recurrence not specified    New Prescriptions New Prescriptions   CLINDAMYCIN (CLEOCIN) 300 MG CAPSULE    Take one cap by mouth every 8 hours.   SULFACETAMIDE (BLEPH-10) 10 % OPHTHALMIC SOLUTION    Place 1-2 drops into the right eye every 3 (three) hours. (Use while awake)     Lattie Haw, MD 08/24/16 1207

## 2016-08-24 NOTE — Discharge Instructions (Signed)
For sinus congestion, may use Afrin nasal spray (or generic oxymetazoline) each morning for about 5 days and then discontinue.  Also recommend using saline nasal spray several times daily and saline nasal irrigation (AYR is a common brand).  Use Flonase nasal spray each morning after using Afrin nasal spray and saline nasal irrigation.   Followup with ophthalmologist if increasing eye pain develops.

## 2016-08-24 NOTE — ED Triage Notes (Signed)
Pt c/o not being able to see out of right eye since Friday.  Pt feels that it's sinus related, having sinus pain and runny nose.

## 2016-08-29 ENCOUNTER — Encounter: Payer: Self-pay | Admitting: Physician Assistant

## 2016-08-29 ENCOUNTER — Ambulatory Visit (INDEPENDENT_AMBULATORY_CARE_PROVIDER_SITE_OTHER): Payer: 59 | Admitting: Physician Assistant

## 2016-08-29 VITALS — BP 124/80 | HR 94 | Resp 16 | Wt 238.6 lb

## 2016-08-29 DIAGNOSIS — I10 Essential (primary) hypertension: Secondary | ICD-10-CM

## 2016-08-29 DIAGNOSIS — E785 Hyperlipidemia, unspecified: Secondary | ICD-10-CM

## 2016-08-29 NOTE — Patient Instructions (Addendum)
- Continue your Lisinopril daily - Follow a heart healthy diet. Increase whole grain, fresh fruits and vegetables - Follow-up in 6 months   Dyslipidemia Dyslipidemia is an imbalance of waxy, fat-like substances (lipids) in the blood. The body needs lipids in small amounts. Dyslipidemia often involves a high level of cholesterol or triglycerides, which are types of lipids. Common forms of dyslipidemia include:  High levels of bad cholesterol (LDL cholesterol). LDL is the type of cholesterol that causes fatty deposits (plaques) to build up in the blood vessels that carry blood away from your heart (arteries).  Low levels of good cholesterol (HDL cholesterol). HDL cholesterol is the type of cholesterol that protects against heart disease. High levels of HDL remove the LDL buildup from arteries.  High levels of triglycerides. Triglycerides are a fatty substance in the blood that is linked to a buildup of plaques in the arteries.  You can develop dyslipidemia because of the genes you are born with (primary dyslipidemia) or changes that occur during your life (secondary dyslipidemia), or as a side effect of certain medical treatments. What are the causes? Primary dyslipidemia is caused by changes (mutations) in genes that are passed down through families (inherited). These mutations cause several types of dyslipidemia. Mutations can result in disorders that make the body produce too much LDL cholesterol or triglycerides, or not enough HDL cholesterol. These disorders may lead to heart disease, arterial disease, or stroke at an early age. Causes of secondary dyslipidemia include certain lifestyle choices and diseases that lead to dyslipidemia, such as:  Eating a diet that is high in animal fat.  Not getting enough activity or exercise (having a sedentary lifestyle).  Having diabetes, kidney disease, liver disease, or thyroid disease.  Drinking large amounts of alcohol.  Using certain types of  drugs.  What increases the risk? You may be at greater risk for dyslipidemia if you are an older man or if you are a woman who has gone through menopause. Other risk factors include:  Having a family history of dyslipidemia.  Taking certain medicines, including birth control pills, steroids, some diuretics, beta-blockers, and some medicines forHIV.  Smoking cigarettes.  Eating a high-fat diet.  Drinking large amounts of alcohol.  Having certain medical conditions such as diabetes, polycystic ovary syndrome (PCOS), pregnancy, kidney disease, liver disease, or hypothyroidism.  Not exercising regularly.  Being overweight or obese with too much belly fat.  What are the signs or symptoms? Dyslipidemia does not usually cause any symptoms. Very high lipid levels can cause fatty bumps under the skin (xanthomas) or a white or gray ring around the black center (pupil) of the eye. Very high triglyceride levels can cause inflammation of the pancreas (pancreatitis). How is this diagnosed? Your health care provider may diagnose dyslipidemia based on a routine blood test (fasting blood test). Because most people do not have symptoms of the condition, this blood testing (lipid profile) is done on adults age 4 and older and is repeated every 5 years. This test checks:  Total cholesterol. This is a measure of the total amount of cholesterol in your blood, including LDL cholesterol, HDL cholesterol, and triglycerides. A healthy number is below 200.  LDL cholesterol. The target number for LDL cholesterol is different for each person, depending on individual risk factors. For most people, a number below 100 is healthy. Ask your health care provider what your LDL cholesterol number should be.  HDL cholesterol. An HDL level of 60 or higher is best because it helps  to protect against heart disease. A number below 40 for men or below 50 for women increases the risk for heart disease.  Triglycerides. A  healthy triglyceride number is below 150.  If your lipid profile is abnormal, your health care provider may do other blood tests to get more information about your condition. How is this treated? Treatment depends on the type of dyslipidemia that you have and your other risk factors for heart disease and stroke. Your health care provider will have a target range for your lipid levels based on this information. For many people, treatment starts with lifestyle changes, such as diet and exercise. Your health care provider may recommend that you:  Get regular exercise.  Make changes to your diet.  Quit smoking if you smoke.  If diet changes and exercise do not help you reach your goals, your health care provider may also prescribe medicine to lower lipids. The most commonly prescribed type of medicine lowers your LDL cholesterol (statin drug). If you have a high triglyceride level, your provider may prescribe another type of drug (fibrate) or an omega-3 fish oil supplement, or both. Follow these instructions at home:  Take over-the-counter and prescription medicines only as told by your health care provider. This includes supplements.  Get regular exercise. Start an aerobic exercise and strength training program as told by your health care provider. Ask your health care provider what activities are safe for you. Your health care provider may recommend: ? 30 minutes of aerobic activity 4-6 days a week. Brisk walking is an example of aerobic activity. ? Strength training 2 days a week.  Eat a healthy diet as told by your health care provider. This can help you reach and maintain a healthy weight, lower your LDL cholesterol, and raise your HDL cholesterol. It may help to work with a diet and nutrition specialist (dietitian) to make a plan that is right for you. Your dietitian or health care provider may recommend: ? Limiting your calories, if you are overweight. ? Eating more fruits, vegetables,  whole grains, fish, and lean meats. ? Limiting saturated fat, trans fat, and cholesterol.  Follow instructions from your health care provider or dietitian about eating or drinking restrictions.  Limit alcohol intake to no more than one drink per day for nonpregnant women and two drinks per day for men. One drink equals 12 oz of beer, 5 oz of wine, or 1 oz of hard liquor.  Do not use any products that contain nicotine or tobacco, such as cigarettes and e-cigarettes. If you need help quitting, ask your health care provider.  Keep all follow-up visits as told by your health care provider. This is important. Contact a health care provider if:  You are having trouble sticking to your exercise or diet plan.  You are struggling to quit smoking or control your use of alcohol. Summary  Dyslipidemia is an imbalance of waxy, fat-like substances (lipids) in the blood. The body needs lipids in small amounts. Dyslipidemia often involves a high level of cholesterol or triglycerides, which are types of lipids.  Treatment depends on the type of dyslipidemia that you have and your other risk factors for heart disease and stroke.  For many people, treatment starts with lifestyle changes, such as diet and exercise. Your health care provider may also prescribe medicine to lower lipids. This information is not intended to replace advice given to you by your health care provider. Make sure you discuss any questions you have with your health  care provider. Document Released: 02/22/2013 Document Revised: 10/15/2015 Document Reviewed: 10/15/2015 Elsevier Interactive Patient Education  [employer].

## 2016-08-29 NOTE — Progress Notes (Signed)
HPI:                                                                Darren Gray is a 46 y.o. male who [facility] today for hypertension follow-up  HTN: taking Lisinopril 10mg  daily. Compliant with medications. Does not check BP's at home. Denies vision change, headache, chest pain with exertion, orthopnea, lightheadedness, syncope and edema. Risk factors include: tobacco use  Past Medical History:  Diagnosis Date  . Gout   . Hyperlipidemia   . Hypertension   . Obesity   . Tobacco abuse    Past Surgical History:  Procedure Laterality Date  . NO PAST SURGERIES     Social History  Substance Use Topics  . Smoking status: Current Every Day Smoker    Packs/day: 2.00    Years: 29.00    Types: Cigarettes  . Smokeless tobacco: Never Used  . Alcohol use No   family history includes Arthritis in his mother; Emphysema in his maternal grandmother; Heart attack in his father; Hypertension in his father; Skin cancer in his father; Stroke in his father.  ROS: negative except as noted in the HPI  Medications: Current Outpatient Prescriptions  Medication Sig Dispense Refill  . loratadine (CLARITIN) 10 MG tablet Take 10 mg by mouth daily.    Marland Kitchen. allopurinol (ZYLOPRIM) 300 MG tablet Take 1 tablet (300 mg total) by mouth daily. 30 tablet 3  . clindamycin (CLEOCIN) 300 MG capsule Take one cap by mouth every 8 hours. 21 capsule 0  . lisinopril (PRINIVIL,ZESTRIL) 10 MG tablet Take 1 tablet (10 mg total) by mouth daily. 30 tablet 3  . naproxen sodium (ANAPROX) 220 MG tablet Take 220 mg by mouth 2 (two) times daily with a meal.    . sulfacetamide (BLEPH-10) 10 % ophthalmic solution Place 1-2 drops into the right eye every 3 (three) hours. (Use while awake) 5 mL 0   No current facility-administered medications for this visit.    Allergies  Allergen Reactions  . Eggs Or Egg-Derived Products        Objective:  BP 124/80   Pulse  94   Resp 16   Wt 238 lb 9.6 oz (108.2 kg)   BMI 33.28 kg/m  Gen: well-groomed, cooperative, not ill-appearing, no distress Pulm: Normal work of breathing, normal phonation, clear to auscultation bilaterally CV: Normal rate, regular rhythm, s1 and s2 distinct, no murmurs, clicks or rubs  Neuro: alert and oriented x 3, EOM's intact, no tremor MSK: moving all extremities, normal gait and station, no peripheral edema Skin: warm, dry, intact; multiple hypopigmented macules on the arms Psych: good eye contact, normal affect, euthymic mood, normal speech and thought content  No results found for this or any previous visit (from the past 72 hour(s)). No results found.    Assessment and Plan: 46 y.o. male with   1. Hypertension goal BP (blood pressure) < 130/80 - BP at goal today. Tolerating ACE. No cough. - continue Lisinopril daily - follow-up in 6 months  2. Dyslipidemia 4424yr ASCVD risk 9.1% Declines statin therapy Declines baby aspirin Therapeutic lifestyle changes   Patient education and anticipatory guidance given Patient agrees with treatment plan Follow-up in 6 months or sooner as  needed if symptoms worsen or fail to improve  Darlyne Russian PA-C

## 2016-09-19 ENCOUNTER — Encounter: Payer: Self-pay | Admitting: Sports Medicine

## 2016-09-19 ENCOUNTER — Ambulatory Visit (INDEPENDENT_AMBULATORY_CARE_PROVIDER_SITE_OTHER): Payer: 59 | Admitting: Sports Medicine

## 2016-09-19 DIAGNOSIS — M1A0690 Idiopathic chronic gout, unspecified knee, without tophus (tophi): Secondary | ICD-10-CM | POA: Diagnosis not present

## 2016-09-19 MED ORDER — NAPROXEN 500 MG PO TABS: 500.0000 mg | ORAL_TABLET | Freq: Two times a day (BID) | ORAL | 3 refills | Status: AC

## 2016-09-19 MED ORDER — INDOMETHACIN 50 MG PO CAPS: ORAL_CAPSULE | ORAL | 3 refills | Status: AC

## 2016-09-19 NOTE — Assessment & Plan Note (Signed)
Doing extremely well, rechecking uric acid levels today, goal is less than 5. He will need a refill on allopurinol if levels are good. I'm going to give him some indomethacin to take if he has a flare in the weekends, otherwise he should come in for joint injection if he flares. He was given colchicine in the past but was too expensive.

## 2016-09-19 NOTE — Progress Notes (Signed)
  Subjective:    CC: Follow-up  HPI: Gout: Did well after knee injection, arthrocentesis confirmed uric acid crystals, doing well on allopurinol, due to recheck uric acid levels.  Past medical history:  Negative.  See flowsheet/record as well for more information.  Surgical history: Negative.  See flowsheet/record as well for more information.  Family history: Negative.  See flowsheet/record as well for more information.  Social history: Negative.  See flowsheet/record as well for more information.  Allergies, and medications have been entered into the medical record, reviewed, and no changes needed.   Review of Systems: No fevers, chills, night sweats, weight loss, chest pain, or shortness of breath.   Objective:    General: Well Developed, well nourished, and in no acute distress.  Neuro: Alert and oriented x3, extra-ocular muscles intact, sensation grossly intact.  HEENT: Normocephalic, atraumatic, pupils equal round reactive to light, neck supple, no masses, no lymphadenopathy, thyroid nonpalpable.  Skin: Warm and dry, no rashes. Cardiac: Regular rate and rhythm, no murmurs rubs or gallops, no lower extremity edema.  Respiratory: Clear to auscultation bilaterally. Not using accessory muscles, speaking in full sentences.  Impression and Recommendations:    Gout Doing extremely well, rechecking uric acid levels today, goal is less than 5. He will need a refill on allopurinol if levels are good. I'm going to give him some indomethacin to take if he has a flare in the weekends, otherwise he should come in for joint injection if he flares. He was given colchicine in the past but was too expensive.  I spent 25 minutes with this patient, greater than 50% was face-to-face time counseling regarding the above diagnoses

## 2016-09-20 LAB — COMPREHENSIVE METABOLIC PANEL WITH GFR
AST: 39 U/L (ref 10–40)
CO2: 22 mmol/L (ref 20–31)
Creat: 1.05 mg/dL (ref 0.60–1.35)
Glucose, Bld: 127 mg/dL — ABNORMAL HIGH (ref 65–99)
Sodium: 140 mmol/L (ref 135–146)
Total Bilirubin: 0.7 mg/dL (ref 0.2–1.2)

## 2016-09-20 LAB — URIC ACID: Uric Acid, Serum: 4.9 mg/dL (ref 4.0–8.0)

## 2016-09-20 LAB — COMPREHENSIVE METABOLIC PANEL
ALT: 60 U/L — ABNORMAL HIGH (ref 9–46)
Albumin: 4.4 g/dL (ref 3.6–5.1)
Alkaline Phosphatase: 51 U/L (ref 40–115)
BUN: 14 mg/dL (ref 7–25)
Calcium: 9.1 mg/dL (ref 8.6–10.3)
Chloride: 102 mmol/L (ref 98–110)
Potassium: 4.1 mmol/L (ref 3.5–5.3)
Total Protein: 6.6 g/dL (ref 6.1–8.1)

## 2017-02-25 ENCOUNTER — Telehealth: Payer: Self-pay

## 2017-02-25 NOTE — Telephone Encounter (Signed)
PT came in to the office today upset that his medicine Lispinopril was not sent in as a year script when he was told it would be. The encounter notes from June state that he needs to come in for a 6 month f/up. He stated that he will not come in for a f/up because "the nurse practitioner or whatever she is, is clearly not a doctor...and didn't know what she was doing". I explained that she was a Surveyor, mininghysician's Assistant and explained what that means, he stated that "I won't see her again because she was just trying to push all different kinds of medication on me because that's how she makes a commission". I explained again that that's not how she gets paid. PT was not reasonable, and upset that he doesn't even need this medication because every time he goes to [employer] he checks his blood pressure and it's within normal levels.  PT was also upset that he did not get an itemized bill. I gave him the number to billing so he can call them to pay the amount he owed. He paid the $15 balance on the account but there is a bad debt balance that he still owes.

## 2018-11-17 IMAGING — DX DG KNEE COMPLETE 4+V*L*
4 series · 4 of 4 positions shown · non-contrast
Comparison: None.

CLINICAL DATA: Pain, swelling of left knee.  Felt a pop yesterday.

EXAM:
LEFT KNEE - COMPLETE 4+ VIEW

[knee ap]
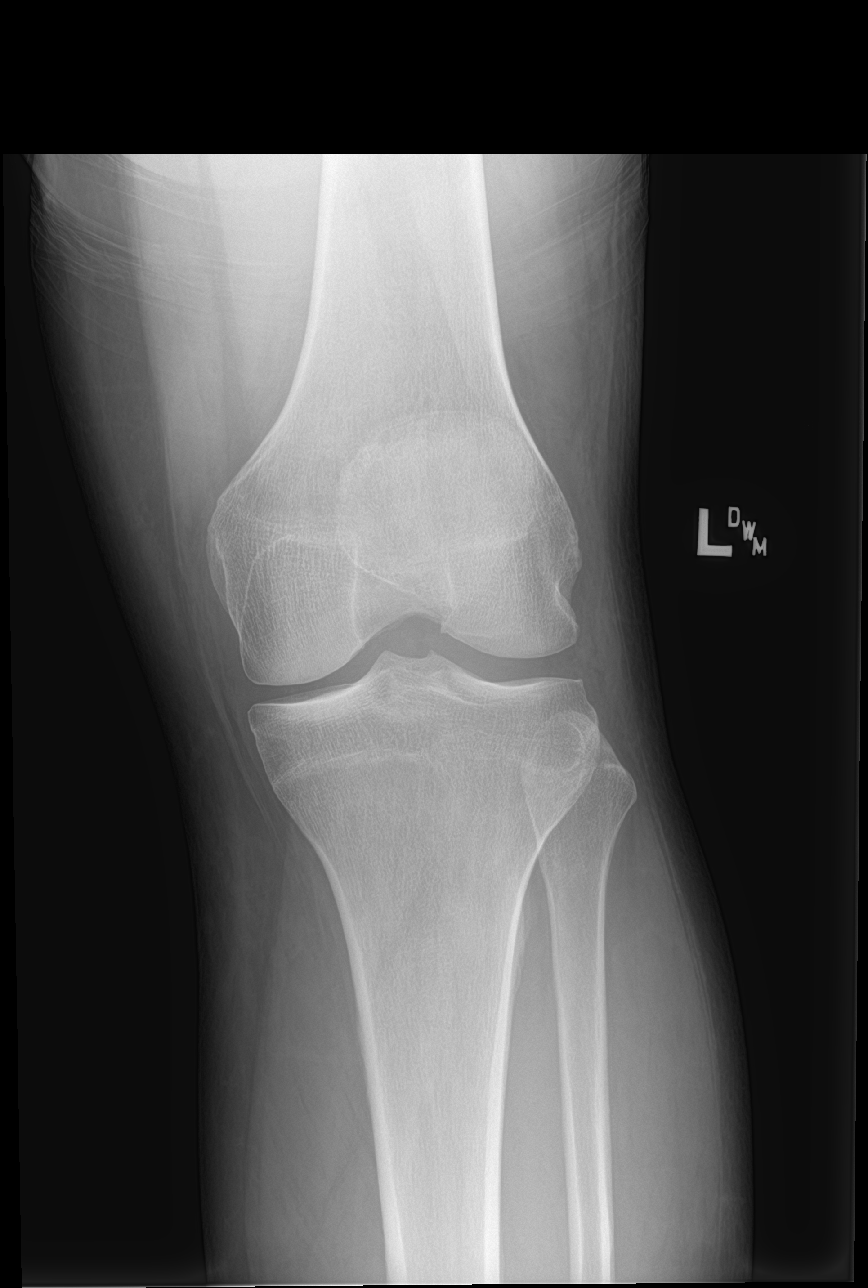

[knee lat]
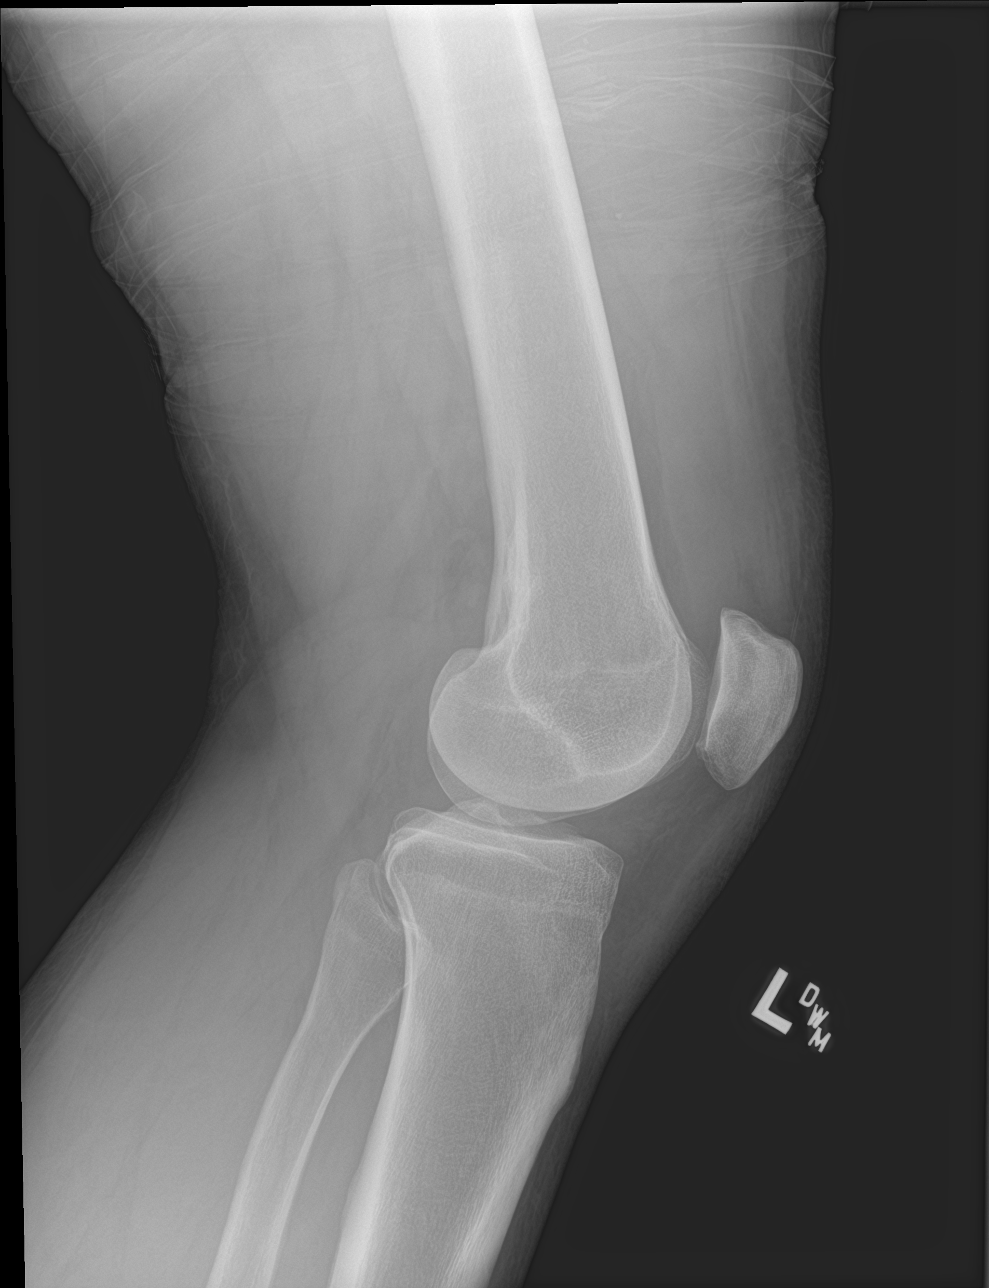

[knee obl (1 of 2)]
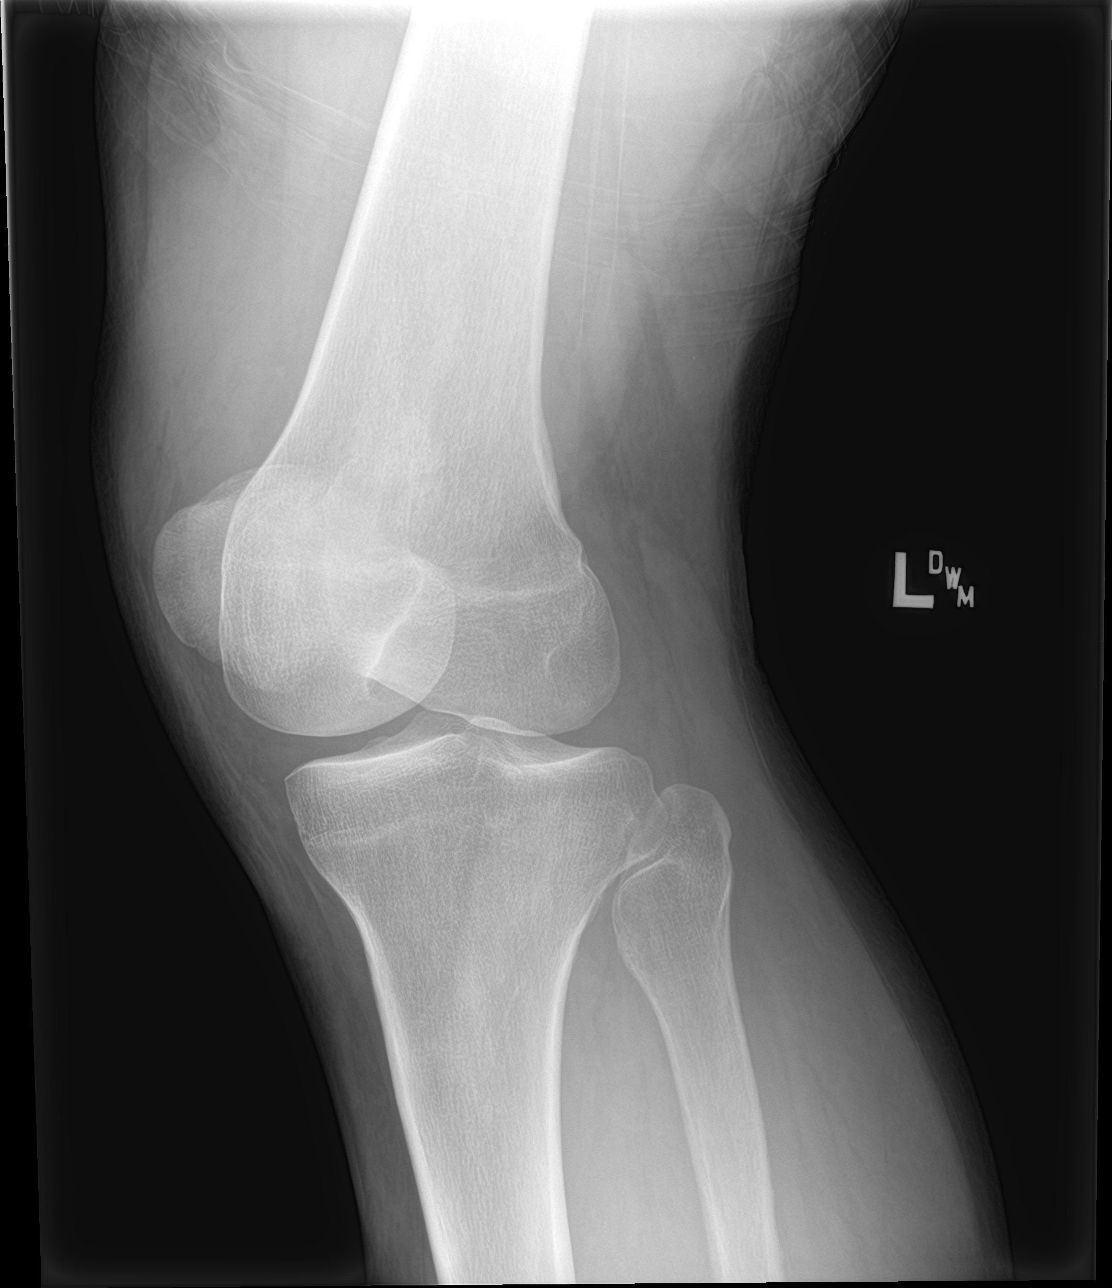

[knee obl (2 of 2)]
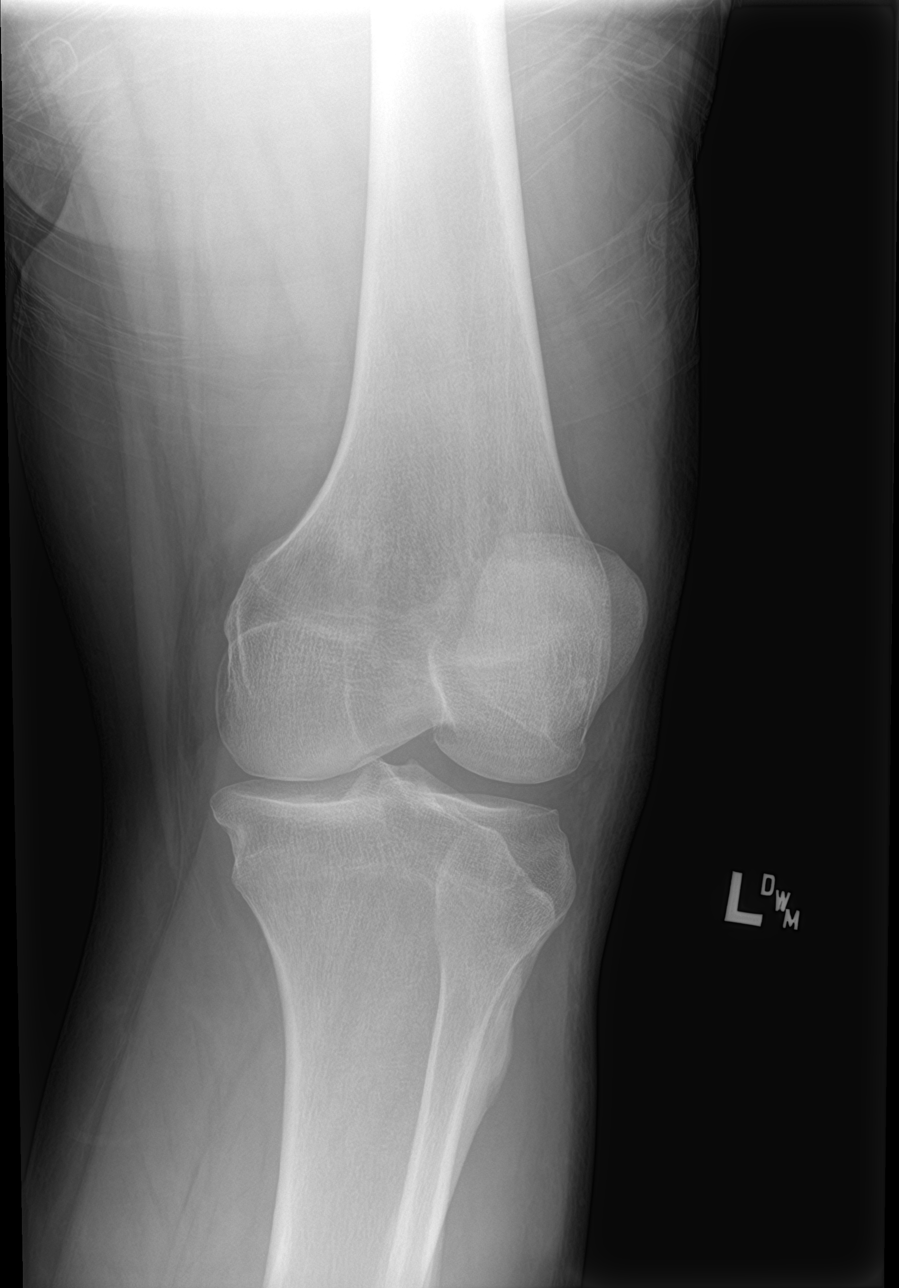

[4 of 4 positions shown; findings below may reference images not displayed]

FINDINGS: Moderate joint effusion. No acute bony abnormality. Specifically, no
fracture, subluxation, or dislocation. Soft tissues are intact.
IMPRESSION: Moderate joint effusion.  No acute bony abnormality.
